# Patient Record
Sex: Male | Born: 1990 | Race: White | Hispanic: No | Marital: Single | Smoking: Never smoker
Health system: Southern US, Community
[De-identification: ages and names within clinical notes are randomized; demographics above are authoritative.]

---
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
## 2009-02-20 ENCOUNTER — Ambulatory Visit: Payer: Self-pay | Admitting: Sports Medicine

## 2009-02-20 DIAGNOSIS — M79609 Pain in unspecified limb: Secondary | ICD-10-CM | POA: Insufficient documentation

## 2009-04-03 ENCOUNTER — Ambulatory Visit: Payer: Self-pay | Admitting: Sports Medicine

## 2010-10-16 ENCOUNTER — Ambulatory Visit (INDEPENDENT_AMBULATORY_CARE_PROVIDER_SITE_OTHER): Payer: 59 | Admitting: Sports Medicine

## 2010-10-16 DIAGNOSIS — M7750 Other enthesopathy of unspecified foot: Secondary | ICD-10-CM

## 2010-10-16 DIAGNOSIS — M79609 Pain in unspecified limb: Secondary | ICD-10-CM

## 2010-10-16 NOTE — Progress Notes (Signed)
  Subjective:    Patient ID: Danny Craig, male    DOB: [birth year], 20 y.o.   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  Previously ran at [facility], but quit 3 weeks ago.  He actually recently took 3 weeks off from running, but recently ran again.  Doesn't think the rest helped.  Still with some mild persistent Lt 1st MTP pain, but controlled with icing bid.  Also with some newer MT head pain under 2nd and 3rd MT heads. Has persistent lateral right knee swelling as well. Has tried [employer], help some.  Otherwise, using [employer]. Plans to run ultimate runner soon. Currently doing 30 MPW. Had MRI of foot done at [facility] recently. Has previously tried multiple different inserts/orthotics with various padding of 1st MTP. He is ok with trying to take time off after ultimate runner if it will help his pain, which is constant, throbbing aching pain during runs and worse afterwards if he does not ice.   Review of Systems Denies F, S, C    Objective:   Physical Exam Gen: WA M NAD Rt knee: FROM, mild-moderate bursal swelling near insertion of Lt ITB Lt foot: + mild blistering on plantar skin near 1st MTP.  Minimal ttp over plantar aspect of 1st MTP and sessamoids.  Minimal ttp over 2nd-4th MT heads.  Neg squeeze test. Fairly normal arched foot. Gait: Forefoot striker with good form. No pronation.  Mild ankle wobble in Newton's shoes.  MRI: Reviewed outside MRI images of Lt foot.  May have small fracture of medial bipartite sessamoid of superior piece.  + mild edema and increased signal around these and 1st MTP joint.  Mild increased signal within 1st MT as well in distal aspect. No obvious fracture.       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 - We gave him new green insoles and added double blue MT bar on Right and same on Left with cut out for 1st MT, he tried these and felt comfortable. - After he does  ultimate runner, take 6 weeks off from running and do X training - Ice baths (not massage) continue bid - NSAIDs prn - trial of mild running after 6 week hiatus (1 mild qod to qd) then follow up with [place] - Reassured him at this time he has no surgical indication  Spent > 30 minutes with patient, > 50% spent counseling face to face time regarding diagnosis, prognosis, and treatment

## 2011-03-20 ENCOUNTER — Encounter: Payer: Self-pay | Admitting: Family Medicine

## 2011-03-20 ENCOUNTER — Ambulatory Visit (INDEPENDENT_AMBULATORY_CARE_PROVIDER_SITE_OTHER): Payer: 59 | Admitting: Family Medicine

## 2011-03-20 VITALS — BP 116/75 | HR 79 | Ht 72.0 in | Wt 155.0 lb

## 2011-03-20 DIAGNOSIS — M2580 Other specified joint disorders, unspecified joint: Secondary | ICD-10-CM | POA: Insufficient documentation

## 2011-03-20 DIAGNOSIS — M79609 Pain in unspecified limb: Secondary | ICD-10-CM

## 2011-03-20 DIAGNOSIS — M948X9 Other specified disorders of cartilage, unspecified sites: Secondary | ICD-10-CM

## 2011-03-20 NOTE — Progress Notes (Signed)
  Subjective:    Patient ID: Danny Craig, male    DOB: 01/30/1991, 20 y.o.   MRN: [id number]  HPI  Complaining of left foot plantar 1st MPT joint. He has had this pain  since 30/2010 when he got injured while running, he was doing bear foot running. He never had issues with his foot before 07/2010  He had  X rays of his left foot the showed a bipartite lateral sesamoid, he had an MRI 05/2010 with high signal in the lateral sesamoid.He has tried different types of insoles with mild relieve. He has tried NSAID, ice massage, stretches, different shoes, nothing really works. He states that his pain is located in the bottom of his first MPT joint , more  localize in the lateral side of the joint. No numbness or tingling. He has change the way he walks putting more pressure in the outside of her foot. He has not run for the last 6 weeks.  Patient Active Problem List  Diagnoses  . METATARSALGIA  . FOOT PAIN, LEFT  . Sesamoiditis   No current outpatient prescriptions on file prior to visit.   No Known Allergies     Review of Systems  Constitutional: Negative for fever, chills, diaphoresis and fatigue.  Musculoskeletal: Positive for gait problem. Negative for myalgias, back pain and joint swelling.  Neurological: Negative for weakness and numbness.       Objective:   Physical Exam  Constitutional: He appears well-developed and well-nourished.       BP 116/75  Pulse 79  Ht 6' (1.829 m)  Wt 155 lb (70.308 kg)  BMI 21.02 kg/m2   Pulmonary/Chest: Effort normal.  Musculoskeletal:       Left foot with intact skin, no swelling no hematomas. FROM at the 1st MPT joint. There is TTP in the plantar aspect of the 1st MTP joint in the lateral sesamoid area. Neurovascularly intact. Gait independent with a limp favoring the left side.   Neurological: He is alert.  Skin: Skin is warm. No rash noted. No erythema. No pallor.  Psychiatric: He has a normal mood and affect.    After obtaining  consent, the skin of the dorsum of the foot at the 1st MPT joint was sterilely prepped with alcohol swabs, ethyl will chloride was used for local topical anesthesia injection of 2 mL 1% lidocaine plus one mL of Kenalog was injected in the 1st MPT joint. The procedure was well-tolerated by the patient. Patient instructed to remain in clinic for 20 minutes afterwards, and to report any adverse reaction to me immediately.        Assessment & Plan:   1. FOOT PAIN, LEFT   2. Sesamoiditis    1st MPT joint cortisone injection. Ice  Rest for the next 72 hrs Cross training starting next week Call next week for f/u after the injection. F/U in 4 weeks. If significant improvement after the injection, gradual returnto running is advice. If no improvement referred to a Foot and ankle specialist is recommended.

## 2011-04-16 ENCOUNTER — Ambulatory Visit (INDEPENDENT_AMBULATORY_CARE_PROVIDER_SITE_OTHER): Payer: 59 | Admitting: Sports Medicine

## 2011-04-16 ENCOUNTER — Encounter: Payer: Self-pay | Admitting: Sports Medicine

## 2011-04-16 VITALS — BP 127/74 | HR 66

## 2011-04-16 DIAGNOSIS — M2580 Other specified joint disorders, unspecified joint: Secondary | ICD-10-CM

## 2011-04-16 DIAGNOSIS — M948X9 Other specified disorders of cartilage, unspecified sites: Secondary | ICD-10-CM

## 2011-04-16 MED ORDER — NITROGLYCERIN 0.2 MG/HR TD PT24: MEDICATED_PATCH | TRANSDERMAL | Status: DC

## 2011-04-16 NOTE — Patient Instructions (Signed)
Great to meet you. Things we discussed: Removing part of the padding distal to your sesamoid on your orthotic. Continue modifying your orthotic. Nitro patches. Come back to see [place] in 6 weeks.  Ihor Austin. Benjamin Stain, M.D. [facility] [address] [place] [address] [phone]

## 2011-04-16 NOTE — Progress Notes (Addendum)
  Subjective:    Patient ID: Danny Craig, male    DOB: [birth year], 20 y.o.   MRN: [id number]  HPI Danny Craig comes in for followup of his left foot sesamoiditis that he's been dealing with since 2010. He was seen approximately 1 month ago, had a corticosteroid injection which completely killed the pain on the day of the injection. Further modifications of his orthotics were performed by him, and he feels like this is approximately 70% better. Currently running approximately 20 miles per week.  Review of Systems    no fevers, chills, night sweats, weight loss. Objective:   Physical Exam General:  Well developed, well nourished, and in no acute distress. Neuro:  Alert and oriented x3, extra-ocular muscles intact. Skin: Warm and dry. Respiratory:  Not using accessory muscles, speaking in full sentences. MSK: Neutral foot shape. No breakdown of long or transverse arches. No abnormal callous No hallux valgus. Excellent great toe motion. Good strength to all motions. No longer tender to palpation over the sesamoids, medial or lateral.      Assessment & Plan:  1. sesamoiditis: I suspect that she will be very successful with continued modifications of zone shoes as he's been doing. We recommended removing some of the extra padding distal to the first metatarsophalangeal joint to allow the great toe to drop, we also recommended considering switching to cycling, we would also like to try nitroglycerin patches. He will come back to see [place] in a period of 6-8 weeks to see how things are going. Surgery is not an option.  I spent over 25 minutes with this patient.

## 2012-06-17 ENCOUNTER — Other Ambulatory Visit (HOSPITAL_COMMUNITY): Payer: Self-pay | Admitting: Orthopedic Surgery

## 2012-06-17 DIAGNOSIS — S82409A Unspecified fracture of shaft of unspecified fibula, initial encounter for closed fracture: Secondary | ICD-10-CM

## 2012-06-17 DIAGNOSIS — M2580 Other specified joint disorders, unspecified joint: Secondary | ICD-10-CM

## 2012-06-21 ENCOUNTER — Encounter (HOSPITAL_COMMUNITY): Payer: Self-pay

## 2012-06-21 ENCOUNTER — Encounter (HOSPITAL_COMMUNITY)
Admission: RE | Admit: 2012-06-21 | Discharge: 2012-06-21 | Disposition: A | Discharge status: Home / Self Care | Payer: No Typology Code available for payment source | Source: Ambulatory Visit | Attending: Orthopedic Surgery | Admitting: Orthopedic Surgery

## 2012-06-21 DIAGNOSIS — M2580 Other specified joint disorders, unspecified joint: Secondary | ICD-10-CM

## 2012-06-21 DIAGNOSIS — M79609 Pain in unspecified limb: Secondary | ICD-10-CM | POA: Insufficient documentation

## 2012-06-21 MED ORDER — TECHNETIUM TC 99M MEDRONATE IV KIT
24.0000 | PACK | Freq: Once | INTRAVENOUS | Status: AC | PRN
  Administered 2012-06-21: 24 via INTRAVENOUS

## 2012-08-19 ENCOUNTER — Other Ambulatory Visit: Payer: Self-pay | Admitting: Orthopedic Surgery

## 2012-08-19 DIAGNOSIS — M79672 Pain in left foot: Secondary | ICD-10-CM

## 2012-08-24 ENCOUNTER — Encounter (HOSPITAL_COMMUNITY)
Admission: RE | Admit: 2012-08-24 | Discharge: 2012-08-24 | Disposition: A | Discharge status: Home / Self Care | Payer: Self-pay | Source: Ambulatory Visit | Attending: Orthopedic Surgery | Admitting: Orthopedic Surgery

## 2012-08-24 DIAGNOSIS — M79672 Pain in left foot: Secondary | ICD-10-CM

## 2012-08-24 DIAGNOSIS — M79609 Pain in unspecified limb: Secondary | ICD-10-CM | POA: Insufficient documentation

## 2012-08-24 MED ORDER — TECHNETIUM TC 99M MEDRONATE IV KIT
25.0000 | PACK | Freq: Once | INTRAVENOUS | Status: AC | PRN
  Administered 2012-08-24: 25 via INTRAVENOUS

## 2014-10-02 IMAGING — NM NM BONE 3 PHASE
2 series · 12 of 12 positions shown · non-contrast
Comparison: Bone scan 06/21/2012

CLINICAL DATA: Foot pain, evaluate left sesamoiditis

NUCLEAR MEDICINE THREE PHASE BONE SCAN
TECHNIQUE: Radionuclide angiographic images, immediate static
blood pool images, and 3-hour delayed static images were obtained
after intravenous injection of radiopharmaceutical.
Radiopharmaceutical: [id number] JEJOYAOS ZMILE TECHNETIUM TC 99M
MEDRONATE IV KIT

[Series 1: fl flow and static · 4.75mm/px · 6 of 40 frames shown (1 of 2)]
[frame 4/40]
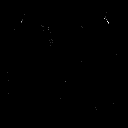
[frame 10/40  full-range]
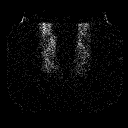
[frame 17/40  full-range]
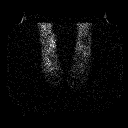
[frame 24/40  full-range]
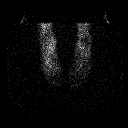
[frame 30/40  full-range]
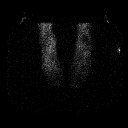
[frame 37/40  full-range]
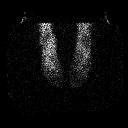

[Series 1: fl flow and static · 4.75mm/px · 6 of 40 frames shown (2 of 2)]
[frame 4/40]
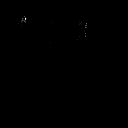
[frame 10/40]
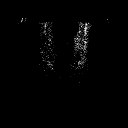
[frame 17/40]
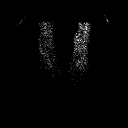
[frame 24/40]
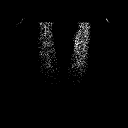
[frame 30/40]
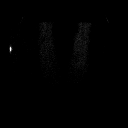
[frame 37/40]
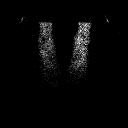

[12 of 12 positions shown; findings below may reference images not displayed]

FINDINGS: Vascular phase:  No asymmetric or increased blood flow to the left
or right foot.

Blood pool phase:  No abnormal blood pool activity within the left
or right foot.

Delayed phase:  No focal activity within the left or right midfoot
or forefoot to suggest fracture or stress fracture. No uptake at
the level of the first metatarsal phalangeal joint of the left or
right to suggest sesamoid injury.

Mild uptake within the left and right calcaneus is symmetric.
IMPRESSION: 1..  No evidence of stress fracture or acute fracture.
2.  Mild uptake within the calcaneus on the left and right is
symmetric and probably the within normal limits but could represent
mild stress reaction.
3..  No abnormal uptake within the region of the sesamoid bones
adjacent to the metatarsal phalangeal joint of the left or right
foot.

## 2015-03-08 ENCOUNTER — Encounter: Payer: Self-pay | Admitting: Sports Medicine

## 2015-03-08 ENCOUNTER — Ambulatory Visit (INDEPENDENT_AMBULATORY_CARE_PROVIDER_SITE_OTHER): Payer: BLUE CROSS/BLUE SHIELD | Admitting: Sports Medicine

## 2015-03-08 VITALS — BP 123/76 | HR 55 | Ht 72.0 in | Wt 165.0 lb

## 2015-03-08 DIAGNOSIS — M2580 Other specified joint disorders, unspecified joint: Secondary | ICD-10-CM

## 2015-03-08 MED ORDER — GABAPENTIN 300 MG PO CAPS: ORAL_CAPSULE | ORAL | Status: DC

## 2015-03-08 NOTE — Progress Notes (Signed)
  Danny Craig - 24 y.o. male MRN [id number]  Date of birth: March 26, 1991 Danny Craig is a 24 y.o. male who presents today for L foot pain.  L foot pain, initial visit - patient presents today with left foot pain affecting his entire bilateral lower extremity. He had previous sesamoiditis of the left foot with a enlarged lateral sesamoid of the first distal metatarsal.  He tried conservative therapy for several years including padding and offloading which did help but eventually needed a sesamoidectomy which was done 2 years ago in [place] by Dr. Dareen PianoAnderson. Since that point he has had plantar pain between the first and second metatarsal heads when trying to 1 which has caused pain along the right IT band as well. He is only able to 1 once or twice every other week without aggravating this area. He has not tried much since then.  PMHx - Updated and reviewed.  Contributory factors include: Non contributory  PSHx - Updated and reviewed.  Contributory factors include:  Seasmoidectomy L foot 2014  FHx - Updated and reviewed.  Contributory factors include:  Noncontributory  Medications - Lexapro   ROS Per HPI.  12 point negative other than per HPI.   Exam:  Filed Vitals:   03/08/15 0956  BP: 123/76  Pulse: 55   Gen: NAD Cardiorespiratory - Normal respiratory effort/rate.  RRR Feet: Pes cavus B/L.  Normal inspection otherwise.  Minimal TTP 1st/2nd MTP space L.  Full ROM B/L.  Neurovascularly intact B/L LE.  Slight splaying digits 1-2   Imaging:  None obtained today

## 2015-03-08 NOTE — Assessment & Plan Note (Signed)
Status post sesamoidectomy and 2014 of the left lateral sesamoid. He continues to have pain in this region which may be secondary to the loading of his biomechanics. -There is chance that scar tissues contributing to this as well with possible nerve irritation. We will try Neurontin 900 mg at night and increase as needed. -Metatarsal pad inserted to help offload the area of pressure/pain on the left side. He will follow-up in 4-6 weeks at which point we will consider possible custom orthotics to help with his biomechanics versus changing the padding to help offload some of this.

## 2015-04-12 ENCOUNTER — Ambulatory Visit: Payer: BLUE CROSS/BLUE SHIELD | Admitting: Sports Medicine

## 2015-06-29 ENCOUNTER — Other Ambulatory Visit: Payer: Self-pay | Admitting: Family Medicine

## 2015-07-17 ENCOUNTER — Ambulatory Visit (INDEPENDENT_AMBULATORY_CARE_PROVIDER_SITE_OTHER): Payer: BLUE CROSS/BLUE SHIELD | Admitting: Sports Medicine

## 2015-07-17 ENCOUNTER — Encounter: Payer: Self-pay | Admitting: Sports Medicine

## 2015-07-17 VITALS — BP 110/70 | Ht 72.0 in | Wt 165.0 lb

## 2015-07-17 DIAGNOSIS — M2580 Other specified joint disorders, unspecified joint: Secondary | ICD-10-CM

## 2015-07-17 MED ORDER — GABAPENTIN 300 MG PO CAPS: ORAL_CAPSULE | ORAL | Status: DC

## 2015-07-17 NOTE — Progress Notes (Signed)
Patient ID: Danny Craig, male   DOB: Jan 12, 1991, 25 y.o.   MRN: [id number]  CC: ongoing foot pain left and leg pain RT  Patient was a college cross-country runner We saw him for a bipartite sesamoid bone first in 2010 Custom orthotics didn't help relieve the pain for a while He got what sounds like a stress fracture in his sesamoid He was seen by Dr. Dareen Piano in [place] who removed the sesamoid  Since then he continues to have sharp pain in the left foot He thinks this changes his gait so that he gets lower leg pain and tightness over the iliotibial band on the right  Since that time we have made several changes in sports insoles with padding around the sesamoid area Pain pattern has not changed much  Last visit we tried gabapentin in case there was some nerve entrapment in the scar tissue this helped reduce the pain by about 50%    Social history;  working now in [place] Plans to return to you [employer] for business graduate school Nonsmoker  Review of systems No foot swelling No numbness Sharp burning pain at times over the left first MTP and great toe No history of gout No redness of the toes  Physical examination Well-developed muscular young man in no acute distress BP 110/70 mmHg  Ht 6' (1.829 m)  Wt 165 lb (74.844 kg)  BMI 22.37 kg/m2  Alignment of both lower extremities is excellent Small scar noted on the plantar surface lateral to the first MTP Squeeze test is non-painful Negative Tinel's No swelling Foot motions are normal Resisted strength flexor hallucis and tibialis posterior are normal Ankle exam is normal Gait is unremarkable

## 2015-07-17 NOTE — Assessment & Plan Note (Signed)
Concerning his chronic sesamoid pain I think we should try making a special type of orthotic with additional forefoot cushions  Start foot exercises using a cord  Continue wearing the most comfortable on his current orthotics  Increase gabapentin to twice a day since he seems to be getting a response and we may later need to go to 3 times a day  Recheck in 2 months

## 2015-07-17 NOTE — Patient Instructions (Signed)
You are probably chronically putting most pressure on RT Work on cadence and be sure this and your gait length are equal r/ l  Do foot exercises with cord Emphasize down and in (plantar flexion and inversion to focus on posterior tib tendon)  Up Gabapentin to twice daily and if helping you can go to 3x day  Let's order the new orthotic with forefoot cusion  Try this and see me in 2 to 3 months  Slow return to running starting at 15 mins. Every other day

## 2015-08-21 ENCOUNTER — Ambulatory Visit (INDEPENDENT_AMBULATORY_CARE_PROVIDER_SITE_OTHER): Payer: BLUE CROSS/BLUE SHIELD | Admitting: Sports Medicine

## 2015-08-21 DIAGNOSIS — M79605 Pain in left leg: Secondary | ICD-10-CM | POA: Diagnosis not present

## 2015-08-21 DIAGNOSIS — M2580 Other specified joint disorders, unspecified joint: Secondary | ICD-10-CM

## 2015-08-21 MED ORDER — GABAPENTIN 600 MG PO TABS: ORAL_TABLET | ORAL | Status: DC

## 2015-08-21 NOTE — Assessment & Plan Note (Signed)
New trial of forefoot padding in custom orthotic to unload pressure  Increase gabapentin since good response This makes local nerve entrapment more likely

## 2015-08-21 NOTE — Progress Notes (Signed)
Patient ID: Danny Craig, male   DOB: 1990/10/21, 25 y.o.   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 The orthotic was heated and afterward the patient stood on the orthotic blank positioned on the orthotic stand. The patient was positioned in subtalar neutral position and 10 degrees of ankle dorsiflexion in a weight bearing stance. After completion of molding, a stable base was applied to the orthotic blank. The blank was ground to a stable position for weight bearing. Size: 10 EVA forefoot with special supercell padding Base: Blue EVA Posting: we left off sessamoid pad Additional orthotic padding: none  Foot cuhsion felt good and post orthotic gait is neutral

## 2015-08-21 NOTE — Assessment & Plan Note (Signed)
Much improved  I spent 35 mins face to face in preparing custom orthotics and over 50% time counseling use of gabapentin and specialized padding to lessen neurogenic pain.

## 2015-09-18 ENCOUNTER — Other Ambulatory Visit: Payer: Self-pay | Admitting: *Deleted

## 2016-09-11 ENCOUNTER — Other Ambulatory Visit: Payer: Self-pay | Admitting: *Deleted

## 2016-09-11 MED ORDER — GABAPENTIN 600 MG PO TABS: ORAL_TABLET | ORAL | 0 refills | Status: DC

## 2016-10-16 ENCOUNTER — Encounter: Payer: Self-pay | Admitting: Sports Medicine

## 2016-10-16 ENCOUNTER — Ambulatory Visit (INDEPENDENT_AMBULATORY_CARE_PROVIDER_SITE_OTHER): Payer: BLUE CROSS/BLUE SHIELD | Admitting: Sports Medicine

## 2016-10-16 DIAGNOSIS — M2580 Other specified joint disorders, unspecified joint: Secondary | ICD-10-CM | POA: Diagnosis not present

## 2016-10-16 MED ORDER — GABAPENTIN 600 MG PO TABS: ORAL_TABLET | ORAL | 5 refills | Status: DC

## 2016-10-16 NOTE — Progress Notes (Signed)
  Danny Craig - 26 y.o. male MRN [id number]  Date of birth: 10/26/1990  SUBJECTIVE:  Including CC & ROS.  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  He had a bipartite sessamoid left first MTP, developed a stress fracture, then ultimately had sesamoid removed. He had multiple orthotics, cut out pads that did not relieve pain. Trial of Gabapentin has helped the most. He also uses an orthotic with special supercell padding.  He has not been running much due to the pain. He is doing other cross training exercises such as weight lifting. He did try to run a 10 mile race in March but had to stop halfway through secondary to sharp pain. He has not noted any swelling.      ROS: No unexpected weight loss, fever, chills, swelling, instability, muscle pain, numbness/tingling, redness, otherwise see HPI   PMHx - Updated and reviewed.  Contributory factors include: Negative PSHx - Updated and reviewed.  Contributory factors include:  Negative FHx - Updated and reviewed.  Contributory factors include:  Negative Social Hx - Updated and reviewed. Contributory factors include: Negative Medications - reviewed   DATA REVIEWED: Prior office notes  PHYSICAL EXAM:  VS: BP:113/80  HR: bpm  TEMP: ( )  RESP:   HT:6' (182.9 cm)   WT:175 lb (79.4 kg)  BMI:23.8 PHYSICAL EXAM: Gen: NAD, alert, cooperative with exam, well-appearing HEENT: clear conjunctiva,  CV:  no edema, capillary refill brisk, normal rate Resp: non-labored Skin: no rashes, normal turgor  Neuro: no gross deficits.  Psych:  alert and oriented  MSK: Alignment of both lower extremities is excellent Small scar noted on the plantar surface lateral to the first MTP. Mild TTP over 1st MTP. Reduced 1st toe extension (30 degrees in L, 60 degrees in R) with normal flexion bilaterally. Squeeze test is non-painful Negative Tinel's No  swelling Foot motions are normal Resisted strength flexor hallucis and tibialis posterior are normal Ankle exam is normal Gait is unremarkable  ASSESSMENT & PLAN:   Sesamoiditis Suspect that his pain is likely secondary to capsular scar with nerve entrapment, which is why the neurontin is improving pain. 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  Plan: Continue gabapentin BID Instructed to work on ROM for big toe and given spike ball massage instructions  Provided exercises for returning to running   I observed and examined the patient with the resident and agree with assessment and plan.  Note reviewed and modified by me. Enid BaasKarl Fields, MD

## 2016-10-16 NOTE — Patient Instructions (Addendum)
Your pain is likely from capsular scar nerve entrapment from surgery. Neurontin helps with the nerve pain. Heat may also help (or warm water). Do not ice your foot as you do not want to injure your nerve.   Work on passive motion with left big toe (lift toe up and back, go as far as you can go). Roll spiky ball massage tool under foot- (roll up and down over foot, go over great toe last). Use it as tolerated. Do not use spoon to break up scar tissue as this is too firm- your foot does not have the protection of your sesamoid.  Exercises to get back into running: 1. Continue to do heel raises on step to stretch achilles, calves     2. Drop squat- lift heels up an inch off the ground, drop down to 45 degrees (train for impact)     3. Drop lunge-- step into lunge with a "pop"     4. Lateral bridges and lateral leg lifts to keep core strong

## 2016-10-16 NOTE — Assessment & Plan Note (Signed)
Suspect that his pain is likely secondary to capsular scar with nerve entrapment, which is why the neurontin is improving pain. 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  Plan: Continue gabapentin BID Instructed to work on ROM for big toe and given spike ball massage instructions  Provided exercises for returning to running

## 2016-11-24 DIAGNOSIS — G479 Sleep disorder, unspecified: Secondary | ICD-10-CM | POA: Diagnosis not present

## 2017-02-23 DIAGNOSIS — F411 Generalized anxiety disorder: Secondary | ICD-10-CM | POA: Diagnosis not present

## 2017-04-01 DIAGNOSIS — F321 Major depressive disorder, single episode, moderate: Secondary | ICD-10-CM | POA: Diagnosis not present

## 2017-04-13 DIAGNOSIS — F321 Major depressive disorder, single episode, moderate: Secondary | ICD-10-CM | POA: Diagnosis not present

## 2017-04-20 DIAGNOSIS — F321 Major depressive disorder, single episode, moderate: Secondary | ICD-10-CM | POA: Diagnosis not present

## 2017-04-30 ENCOUNTER — Ambulatory Visit (INDEPENDENT_AMBULATORY_CARE_PROVIDER_SITE_OTHER): Payer: BLUE CROSS/BLUE SHIELD | Admitting: Sports Medicine

## 2017-04-30 ENCOUNTER — Encounter: Payer: Self-pay | Admitting: Sports Medicine

## 2017-04-30 VITALS — BP 104/70 | Ht 72.0 in | Wt 170.0 lb

## 2017-04-30 DIAGNOSIS — M2580 Other specified joint disorders, unspecified joint: Secondary | ICD-10-CM

## 2017-04-30 DIAGNOSIS — M79605 Pain in left leg: Secondary | ICD-10-CM | POA: Diagnosis not present

## 2017-04-30 MED ORDER — GABAPENTIN 600 MG PO TABS: ORAL_TABLET | ORAL | 5 refills | Status: AC

## 2017-04-30 NOTE — Progress Notes (Signed)
   Subjective:    Patient ID: Danny Craig Apperson, male    DOB: [birth year], 26 y.o.   MRN: [id number]  HPI  Here for follow up of L foot pain. Last seen on 10/16/16 and thought ot have nerve pain in L foot due to nerve entrapment secondary to capsular scar from sesamoidectomy in 2014. Has been doing well on gabapentin, taking 600mg  in am and afternoon with 1200mg  at night. Has been doing targeting stretching and exercises of L foot/leg with use of a muscle stimulator which seems to be giving him some relief but this routine is quiet extensive and he would like some further guidance on how to proceed with exercises. Has also modified his orthotics as he feels better with more forefoot padding.    Review of Systems  Musculoskeletal:       R forefoot pain  Neurological: Negative for weakness and numbness.  Gets periodic ITB sxs on RT/ suspect compensation for left foot     Objective:   Physical Exam  GEN: fit appearing W M BP 104/70   Ht 6' (1.829 m)   Wt 170 lb (77.1 kg)   BMI 23.06 kg/m   MSK: full active ROM of b/l feet.  No point tenderness on plantar surface of L foot.  No edema. No ankle laxity.  Gait with slight decrease in push off on L forefoot.      Assessment & Plan:  FOOT PAIN, LEFT Patient has continued nerve pain in L foot from nerve entrapment in capsular scar. Continue gabapentin at current dosing of 600mg  am, 600mg  pm, 1200mg  qhs. L orthotic modified today with a larger metatarsal pad and some additional padding with continued slight decreased pushing off with L forefoot. Patient would benefit from PT to work on biomechanics, will [facility].  Leland HerElsia J Karlea Mckibbin, DO PGY-2, [facility] 04/30/2017 1:26 PM   I observed and examined the patient with the resident and agree with assessment and plan.  Note reviewed and modified by me. Enid BaasKarl Fields, MD

## 2017-04-30 NOTE — Assessment & Plan Note (Addendum)
Patient has continued nerve pain in L foot from nerve entrapment in capsular scar. Continue gabapentin at current dosing of 600mg  am, 600mg  pm, 1200mg  qhs. L orthotic modified today with a larger metatarsal pad and some additional padding with continued slight decreased pushing off with L forefoot. Patient would benefit from PT to work on biomechanics, [facility].

## 2017-05-20 DIAGNOSIS — F332 Major depressive disorder, recurrent severe without psychotic features: Secondary | ICD-10-CM | POA: Diagnosis not present

## 2017-06-08 DIAGNOSIS — F321 Major depressive disorder, single episode, moderate: Secondary | ICD-10-CM | POA: Diagnosis not present

## 2017-06-15 DIAGNOSIS — F321 Major depressive disorder, single episode, moderate: Secondary | ICD-10-CM | POA: Diagnosis not present

## 2017-10-21 ENCOUNTER — Other Ambulatory Visit: Payer: Self-pay | Admitting: Family Medicine

## 2017-10-27 DIAGNOSIS — F411 Generalized anxiety disorder: Secondary | ICD-10-CM | POA: Diagnosis not present

## 2018-06-09 DIAGNOSIS — G479 Sleep disorder, unspecified: Secondary | ICD-10-CM | POA: Diagnosis not present

## 2018-06-09 DIAGNOSIS — F411 Generalized anxiety disorder: Secondary | ICD-10-CM | POA: Diagnosis not present

## 2018-06-09 DIAGNOSIS — M79672 Pain in left foot: Secondary | ICD-10-CM | POA: Diagnosis not present

## 2018-06-09 DIAGNOSIS — F332 Major depressive disorder, recurrent severe without psychotic features: Secondary | ICD-10-CM | POA: Diagnosis not present

## 2018-07-14 DIAGNOSIS — F321 Major depressive disorder, single episode, moderate: Secondary | ICD-10-CM | POA: Diagnosis not present

## 2018-11-30 DIAGNOSIS — G479 Sleep disorder, unspecified: Secondary | ICD-10-CM | POA: Diagnosis not present

## 2018-11-30 DIAGNOSIS — F332 Major depressive disorder, recurrent severe without psychotic features: Secondary | ICD-10-CM | POA: Diagnosis not present

## 2018-11-30 DIAGNOSIS — M79672 Pain in left foot: Secondary | ICD-10-CM | POA: Diagnosis not present

## 2018-11-30 DIAGNOSIS — F411 Generalized anxiety disorder: Secondary | ICD-10-CM | POA: Diagnosis not present

## 2018-12-16 DIAGNOSIS — F321 Major depressive disorder, single episode, moderate: Secondary | ICD-10-CM | POA: Diagnosis not present

## 2018-12-27 DIAGNOSIS — F321 Major depressive disorder, single episode, moderate: Secondary | ICD-10-CM | POA: Diagnosis not present

## 2019-01-07 DIAGNOSIS — F321 Major depressive disorder, single episode, moderate: Secondary | ICD-10-CM | POA: Diagnosis not present

## 2019-05-30 ENCOUNTER — Ambulatory Visit: Payer: BC Managed Care – PPO | Attending: Internal Medicine

## 2019-05-30 DIAGNOSIS — Z20822 Contact with and (suspected) exposure to covid-19: Secondary | ICD-10-CM

## 2019-05-31 LAB — NOVEL CORONAVIRUS, NAA: SARS-CoV-2, NAA: DETECTED — AB
